# Patient Record
Sex: Female | Born: 1935 | Race: White | Hispanic: No | Smoking: Never smoker
Health system: Southern US, Community
[De-identification: ages and names within clinical notes are randomized; demographics above are authoritative.]

## PROBLEM LIST (undated history)

## (undated) DIAGNOSIS — I219 Acute myocardial infarction, unspecified: Secondary | ICD-10-CM

## (undated) DIAGNOSIS — J45909 Unspecified asthma, uncomplicated: Secondary | ICD-10-CM

## (undated) HISTORY — PX: CORONARY ARTERY BYPASS GRAFT: SHX141

## (undated) HISTORY — PX: ABDOMINAL HYSTERECTOMY: SHX81

---

## 1999-05-13 ENCOUNTER — Inpatient Hospital Stay (HOSPITAL_COMMUNITY): Admission: AD | Admit: 1999-05-13 | Discharge: 1999-05-28 | Payer: Self-pay | Admitting: Cardiology

## 1999-05-14 ENCOUNTER — Encounter: Payer: Self-pay | Admitting: Cardiothoracic Surgery

## 1999-05-15 ENCOUNTER — Encounter: Payer: Self-pay | Admitting: Thoracic Surgery (Cardiothoracic Vascular Surgery)

## 1999-05-16 ENCOUNTER — Encounter: Payer: Self-pay | Admitting: Thoracic Surgery (Cardiothoracic Vascular Surgery)

## 1999-05-17 ENCOUNTER — Encounter: Payer: Self-pay | Admitting: Thoracic Surgery (Cardiothoracic Vascular Surgery)

## 1999-05-20 ENCOUNTER — Encounter: Payer: Self-pay | Admitting: Thoracic Surgery (Cardiothoracic Vascular Surgery)

## 1999-05-23 ENCOUNTER — Encounter: Payer: Self-pay | Admitting: Thoracic Surgery (Cardiothoracic Vascular Surgery)

## 1999-10-06 ENCOUNTER — Inpatient Hospital Stay (HOSPITAL_COMMUNITY): Admission: EM | Admit: 1999-10-06 | Discharge: 1999-10-07 | Payer: Self-pay | Admitting: Cardiovascular Disease

## 2000-07-27 ENCOUNTER — Ambulatory Visit (HOSPITAL_COMMUNITY): Admission: RE | Admit: 2000-07-27 | Discharge: 2000-07-27 | Payer: Self-pay | Admitting: Family Medicine

## 2000-08-18 ENCOUNTER — Ambulatory Visit (HOSPITAL_COMMUNITY): Admission: RE | Admit: 2000-08-18 | Discharge: 2000-08-18 | Payer: Self-pay | Admitting: Internal Medicine

## 2000-08-18 ENCOUNTER — Encounter: Payer: Self-pay | Admitting: Internal Medicine

## 2000-10-04 ENCOUNTER — Ambulatory Visit (HOSPITAL_COMMUNITY): Admission: RE | Admit: 2000-10-04 | Discharge: 2000-10-04 | Payer: Self-pay | Admitting: Family Medicine

## 2000-10-04 ENCOUNTER — Encounter: Payer: Self-pay | Admitting: Family Medicine

## 2000-10-08 ENCOUNTER — Encounter: Payer: Self-pay | Admitting: Family Medicine

## 2000-10-08 ENCOUNTER — Ambulatory Visit (HOSPITAL_COMMUNITY): Admission: RE | Admit: 2000-10-08 | Discharge: 2000-10-08 | Payer: Self-pay | Admitting: Family Medicine

## 2000-12-07 ENCOUNTER — Encounter: Payer: Self-pay | Admitting: Family Medicine

## 2000-12-07 ENCOUNTER — Ambulatory Visit (HOSPITAL_COMMUNITY): Admission: RE | Admit: 2000-12-07 | Discharge: 2000-12-07 | Payer: Self-pay | Admitting: Family Medicine

## 2003-06-11 ENCOUNTER — Ambulatory Visit (HOSPITAL_COMMUNITY): Admission: RE | Admit: 2003-06-11 | Discharge: 2003-06-11 | Payer: Self-pay | Admitting: Internal Medicine

## 2003-06-11 ENCOUNTER — Encounter (HOSPITAL_COMMUNITY): Admission: RE | Admit: 2003-06-11 | Discharge: 2003-07-11 | Payer: Self-pay | Admitting: *Deleted

## 2003-11-06 ENCOUNTER — Ambulatory Visit (HOSPITAL_COMMUNITY): Admission: RE | Admit: 2003-11-06 | Discharge: 2003-11-06 | Payer: Self-pay | Admitting: Family Medicine

## 2004-02-13 ENCOUNTER — Ambulatory Visit (HOSPITAL_COMMUNITY): Admission: RE | Admit: 2004-02-13 | Discharge: 2004-02-13 | Payer: Self-pay | Admitting: Family Medicine

## 2004-05-02 ENCOUNTER — Ambulatory Visit (HOSPITAL_COMMUNITY): Admission: RE | Admit: 2004-05-02 | Discharge: 2004-05-02 | Payer: Self-pay | Admitting: Family Medicine

## 2004-06-27 ENCOUNTER — Ambulatory Visit (HOSPITAL_COMMUNITY): Admission: RE | Admit: 2004-06-27 | Discharge: 2004-06-27 | Payer: Self-pay | Admitting: Family Medicine

## 2005-02-10 ENCOUNTER — Ambulatory Visit: Payer: Self-pay | Admitting: Internal Medicine

## 2005-06-08 ENCOUNTER — Ambulatory Visit: Payer: Self-pay | Admitting: Internal Medicine

## 2005-07-17 ENCOUNTER — Ambulatory Visit (HOSPITAL_COMMUNITY): Admission: RE | Admit: 2005-07-17 | Discharge: 2005-07-17 | Payer: Self-pay | Admitting: Family Medicine

## 2005-09-08 ENCOUNTER — Ambulatory Visit: Payer: Self-pay | Admitting: *Deleted

## 2005-09-10 ENCOUNTER — Encounter (HOSPITAL_COMMUNITY): Admission: RE | Admit: 2005-09-10 | Discharge: 2005-09-11 | Payer: Self-pay | Admitting: *Deleted

## 2005-09-11 ENCOUNTER — Ambulatory Visit: Payer: Self-pay | Admitting: *Deleted

## 2005-09-16 ENCOUNTER — Ambulatory Visit: Payer: Self-pay | Admitting: *Deleted

## 2005-11-13 ENCOUNTER — Ambulatory Visit (HOSPITAL_COMMUNITY): Admission: RE | Admit: 2005-11-13 | Discharge: 2005-11-13 | Payer: Self-pay

## 2005-11-25 ENCOUNTER — Ambulatory Visit: Payer: Self-pay | Admitting: Internal Medicine

## 2005-12-09 ENCOUNTER — Ambulatory Visit (HOSPITAL_COMMUNITY): Admission: RE | Admit: 2005-12-09 | Discharge: 2005-12-09 | Payer: Self-pay | Admitting: Family Medicine

## 2005-12-14 ENCOUNTER — Ambulatory Visit: Payer: Self-pay | Admitting: Internal Medicine

## 2005-12-14 ENCOUNTER — Ambulatory Visit (HOSPITAL_COMMUNITY): Admission: RE | Admit: 2005-12-14 | Discharge: 2005-12-14 | Payer: Self-pay | Admitting: Internal Medicine

## 2006-01-20 ENCOUNTER — Ambulatory Visit: Payer: Self-pay | Admitting: Internal Medicine

## 2006-02-05 ENCOUNTER — Ambulatory Visit: Payer: Self-pay | Admitting: Internal Medicine

## 2006-02-16 ENCOUNTER — Encounter (HOSPITAL_COMMUNITY): Admission: RE | Admit: 2006-02-16 | Discharge: 2006-03-18 | Payer: Self-pay | Admitting: Internal Medicine

## 2006-03-11 ENCOUNTER — Ambulatory Visit: Payer: Self-pay | Admitting: Internal Medicine

## 2006-06-02 ENCOUNTER — Ambulatory Visit: Payer: Self-pay | Admitting: Cardiovascular Disease

## 2008-12-18 ENCOUNTER — Ambulatory Visit (HOSPITAL_COMMUNITY): Admission: RE | Admit: 2008-12-18 | Discharge: 2008-12-18 | Payer: Self-pay | Admitting: Family Medicine

## 2009-01-31 ENCOUNTER — Ambulatory Visit (HOSPITAL_COMMUNITY): Admission: RE | Admit: 2009-01-31 | Discharge: 2009-01-31 | Payer: Self-pay | Admitting: Family Medicine

## 2009-09-24 ENCOUNTER — Ambulatory Visit (HOSPITAL_COMMUNITY): Admission: RE | Admit: 2009-09-24 | Discharge: 2009-09-24 | Payer: Self-pay | Admitting: Family Medicine

## 2010-08-08 NOTE — Procedures (Signed)
[name], [name] NO.:  [id number]   MEDICAL RECORD NO.:  [id number]          PATIENT TYPE:  REC   LOCATION:  RADP                          FACILITY:  [facility]   PHYSICIAN:  Vida Roller, M.D.   DATE OF BIRTH:  01-Jul-1926   DATE OF PROCEDURE:  09/10/2005  DATE OF DISCHARGE:                                    STRESS TEST   HISTORY:  [name] is a 77 year old female with known coronary disease  status post CABG 2001, Adenosine Cardiolite 2005 with a low-risk scan with  normal LV function.  The patient now with recurrent episode of chest  discomfort.   BASELINE DATA:  Electrocardiogram reveals sinus rhythm at 74 beats per  minute, poor R wave progression blood pressure is 148/60.   DESCRIPTION OF PROCEDURE:  Dobutamine was infused up to 30 mcg with the  addition of isometric exercise.  The patient had significant chest  discomfort, shortness of breath, nausea and flushing.  No arrhythmias or  ischemic changes were noted during dobutamine infusion.  Dobutamine was  stopped secondary to her severe chest discomfort and the patient being  unable to tolerate this.  She had reach to 81% of predicted maximum heart  rate.  Myoview was injected.  She states this episode of chest discomfort  was similar to her episode she described to Dr. Dorethea Clan in the office.   Symptoms resolved after about 13 minutes in recovery.   Images and results are pending M.D. review.      Jae Dire, P.A. [facility]      Vida Roller, M.D.  Electronically Signed    AB/MEDQ  D:  09/10/2005  T:  09/10/2005  Job:  [id number]

## 2010-08-08 NOTE — Op Note (Signed)
[name], [name]             ACCOUNT NO.:  [id number]   MEDICAL RECORD NO.:  [id number]          PATIENT TYPE:  AMB   LOCATION:  DAY                           FACILITY:  [facility]   PHYSICIAN:  Lionel December, M.D.    DATE OF BIRTH:  05/17/1926   DATE OF PROCEDURE:  12/14/2005  DATE OF DISCHARGE:  12/14/2005                                  PROCEDURE NOTE   PROCEDURE:  Esophagogastroduodenoscopy followed by colonoscopy.   ENDOSCOPIST:  Lionel December, M.D.   INDICATIONS:  [name] is a 77 year old Caucasian female with chronic GERD, who  remains refractory to therapy.  Unfortunately, she has not been able to  tolerate PPI and is presently antireflux measures and H2B.  She is also  undergoing colonoscopy for surveillance purposes.  She had a sigmoidoscopy  in October 1999 with removal of a small tubular adenoma, but has not been  able to return for colonoscopy until now.  Family history is significant for  colon carcinoma in her father.  She is status post therapy for H. pylori  gastritis in the past.   Procedure risks were reviewed the patient and informed consent was obtained.   MEDICATIONS FOR CONSCIOUS SEDATION:  Benzocaine spray for pharyngeal topical  anesthesia, Demerol 50 mg IV, Versed 6 mg IV in divided dose.   FINDINGS:  Procedure was performed in endoscopy suite.  The patient's vital  signs and O2 SATs were monitored during the procedure and remained stable.   PROCEDURE #1 -- ESOPHAGOGASTRODUODENOSCOPY:  The patient was placed in the  left lateral decubitus position and Olympus videoscope was passed via  oropharynx without any difficulty into esophagus.   ESOPHAGUS:  Mucosa of the esophagus was normal.  The GE junction was at 36  cm from the incisors.  No erosions were noted on this exam, as on a previous  exam of August 2001.  Hiatus was at 39 cm.  She had a small sliding hiatal  hernia.  Incomplete ring was noted involving the right half of GEJ.   STOMACH:  It was  empty and distended very well with insufflation.  Folds of  the proximal stomach were normal.  Examination of mucosa at body, antrum,  pyloric channel as well as angularis, fundus and cardia was normal.   DUODENUM:  Bulbar mucosa was normal.  Scope was passed into second part of  the duodenum, where the mucosa and folds were normal.  Endoscope was  withdrawn and the patient prepared for procedure #2.   PROCEDURE #2 - COLONOSCOPY:  Rectal examination was performed.  No  abnormality was noted on external or digital exam.  Olympus videoscope was  placed in the rectum and advanced under vision into sigmoid colon and  beyond.  Preparation was excellent.  A single small diverticulum was noted  in the sigmoid colon.  Scope was passed into the cecum, which was identified  by appendiceal orifice and ileocecal valve.  Pictures taken for the record.  As the scope was withdrawn, colonic mucosa was examined for the second time  and no other abnormalities were noted.  Rectal mucosa was normal.  Scope was  retroflexed to examine anorectal junction and hemorrhoids were noted below  the dentate line.  Endoscope was straightened and withdrawn.  The patient  tolerated the procedure well.   FINAL DIAGNOSES:  1. Small sliding hiatal hernia, otherwise normal      esophagogastroduodenoscopy.  2. A single small diverticulum at sigmoid colon and external hemorrhoids,      otherwise normal colonoscopy.   RECOMMENDATIONS:  1. She will continue antireflux measures with Zantac at 150 mg in a.m. and      300 mg at bedtime.  2. Reglan 5 mg p.o. a.c. and nightly.  Prescription given for 60 pills      with 2 refills.   She will return for OV in 1 month from now.      Lionel December, M.D.  Electronically Signed     NR/MEDQ  D:  12/14/2005  T:  12/16/2005  Job:  [id number]   cc:   Angus G. Renard Matter, MD  Fax: [phone]

## 2010-08-08 NOTE — Procedures (Signed)
[name], [name]                       ACCOUNT NO.:  [id number]   MEDICAL RECORD NO.:  [id number]                   PATIENT TYPE:  OUT   LOCATION:  RAD                                  FACILITY:  [facility]   PHYSICIAN:  Vida Roller, M.D.                DATE OF BIRTH:  October 29, 1926   DATE OF PROCEDURE:  06/11/2003  DATE OF DISCHARGE:                                  ECHOCARDIOGRAM   PRIMARY CARE PHYSICIAN:  Angus G. Renard Matter, M.D.   TAPE NUMBER:  [id number], tape count [id number].   INDICATION:  A 77 year old female with known coronary artery disease, status  post bypass surgery in 2001 with increased shortness of breath.   TECHNICAL QUALITY:  Limited.   M-MODE TRACINGS:  1. The aorta is 28 mm.  2. Left atrium is 42 mm.  3. Septum 12 mm.  4. Left ventricular posterior wall is 12 mm.  5. Left ventricular diastolic function is 43 mm.  6. Left ventricular systolic dimension is 27 mm.   2-D AND DOPPLER IMAGING:  1. The left ventricle is normal size with normal systolic function.     Estimated ejection fraction of 65-70%.  There is elements of mild,     concentric hypertrophy.  There is a mild relaxation abnormality seen on     transmitral Doppler pattern.  There are no obvious wall-motion     abnormalities.  2. The right ventricle is normal size with normal systolic function though     not well seen.  3. Both atria appear to be top limits of normal, left greater than right.  4. The aortic valve is sclerotic with no evidence of stenosis or     regurgitation.  5. The mitral valve is mildly thickened and myomatous with mild     insufficiency.  6. The tricuspid valve is morphologically unremarkable with mild     insufficiency.  7. The pulmonic valve was not well seen.  8. The inferior vena cava was not well seen.  9. The ascending aorta is not well seen.  10.      There is a small area of lucency around the pericardium which     probably represents a pericardial fat pad  versus a hemodynamically     insignificant effusion.     ___________________________________________                                            Vida Roller, M.D.   JH/MEDQ  D:  06/11/2003  T:  06/12/2003  Job:  [id number]

## 2010-08-08 NOTE — Procedures (Signed)
NAMECARMELIA, TINER                       ACCOUNT NO.:  [id number]   MEDICAL RECORD NO.:  [id number]                   PATIENT TYPE:  OUT   LOCATION:  RAD                                  FACILITY:  [facility]   PHYSICIAN:  Vida Roller, M.D.                DATE OF BIRTH:  [birth year]   DATE OF PROCEDURE:  DATE OF DISCHARGE:                                    STRESS TEST   PROCEDURE:  Adenosine Cardizem.   INDICATION:  A 78 year old female with known coronary artery disease, status  post coronary artery bypass graft in 2001, with the following grafts:  LIMA  to LAD, SVG to OM1, SVG to D1.  The patient now presents with atypical chest  discomfort.   BASELINE DATA:  EKG revealed sinus bradycardia at 55 beats per minute with  nonspecific ST abnormalities.  Blood pressure 138/60.   Adenosine 58 mg was infused over four minute protocol with Cardiolite  injected at three minutes.  The patient reported chest pain, shortness of  breath, flushing and nausea, all of which resolved in recovery.  EKG  revealed no ischemic changes and no arrhythmias.   FINAL IMAGES AND RESULTS:  Pending M.D. review.     ________________________________________  ___________________________________________  Jae Dire, P.A. [facility]                      Vida Roller, M.D.   AB/MEDQ  D:  06/11/2003  T:  06/12/2003  Job:  [id number]

## 2010-08-08 NOTE — Consult Note (Signed)
[place]  Patient:    [name], [name]                      MRN: [id number] Proc. Date: 05/13/99 Adm. Date:  [id number] Attending:  Mirian Mo CC:         Jesse Sans. Wall, M.D. [facility]             CVTS Office                          Consultation Report  REASON FOR CONSULTATION:  Severe three-vessel coronary disease, with recent admission for unstable angina.  HISTORY OF PRESENT ILLNESS:  The patient is a 77 year old female from [place], [place], whose primary care physician is Dr. Renard Matter.  She is admitted for cardiac catheterization by Dr. Valera Castle.  She has a history of congestive heart failure and was apparently evaluated by Dr. Daleen Squibb in his [place] office last summer for stress test and echocardiogram; the results of which apparently were unremarkable. Over the past few days she developed heat intolerance and diaphoresis.  This was associated by progressive dyspnea on exertion and chest pressure and chest tightness.  She took some nitroglycerin without relief, and presented to the emergency room at [facility] on May 12, 1999.  At that time her EKG showed no acute changes, and she was admitted for rule out MI and for treatment of her unstable angina.  She was evaluated by Dr. Daleen Squibb in [facility] and she was transferred to [facility] for catheterization.  She was felt to possibly have diastolic dysfunction as a component of her congestive heart failure, but lso had a borderline elevation of her Troponin enzyme.  She was placed on nitroglycerin and Lovenox; transferred to [facility].  PAST MEDICAL HISTORY: 1. Obesity. 2. Asthma. 3. Hypertension. 4. History of esophageal strictures, status post esophageal dilatation. 5. Hysterectomy. 6. Left ear surgery.  Apparently at the time of the ear surgery, vein was    harvested from both ankles for use in the reconstruction of her ear tissue.  HOME  MEDICATIONS: 1. Potassium 20 mEq q.d. 2. Premarin 1.2 mg q.d. 3. Avapro 150 mg p.o. q.d. 4. Lasix 40 mg q.d. 5. Levaquin 500 mg p.o. q.d. 6. Prevacid 30 mg p.o. q.d.  ALLERGIES:  PENICILLIN (causes hives),  ASPIRIN (states gives her an upset stomach).  SOCIAL HISTORY:  The patient lives with her disabled mother, who is in her late 79s and has Alzheimers disease.  Her husband, who is in his mid 28s, lives in a nursing home with Parkinsons disease and also some dementia.  She has no children. She has been under a lot of family stress lately due to illnesses.  She denies cigarette smoking or alcohol use.  REVIEW OF SYSTEMS:  The patients surgical history is also positive for lower back surgery, performed at [facility] by Dr. Gordy Levan.  She denies any change in her weight, which is approximately 220 pounds.  She denies any night sweats, fever, change in bowel or bladder habits.  She denies any history of TIA or CVA, syncope or seizure.  She does have history of angina and symptoms of dyspnea on exertion and orthopnea (as stated in previous observations).  Her pulmonary history is positive for asthma, and she takes intermittent bronchodilator MDI.  She denies any serious pulmonary infections, TB or hemoptysis.  She does have intermittent dysphagia, with  reflux, and a history of esophageal dilatations.  She denies any blood per rectum, jaundice or chronic abdominal pain.  There is a questionable history of colitis.  She denies any history of hematuria, kidney stones or polyuria.  She denies any deep venous thrombosis, phlebitis or claudication. She does have problems with her right knee with arthritis and swelling. Hematologically she denies any bleeding, diathesis or easy bruisability.  There is no history of skin rash, skin cancer or lesions.  She does have probable recent  depression with stress from her family illnesses, with some problems with insomnia and her  appetite.  PHYSICAL EXAMINATION:  VITAL SIGNS:  Height 5 foot 2 inches, Weight 220 pounds.  Blood pressure 115/60, pulse 78 and regular.  She is afebrile.  GENERAL APPEARANCE:  That of an elderly, obese white female, in no acute distress.  HEENT:  Normocephalic, with full EOMs.  Pharynx is clear.  She has dental plates, upper and lower.  NECK:  Short, without thyromegaly, mass or carotid bruits.  She has good range f motion.  LUNGS:  Clear to auscultation.  CARDIAC:  Revealed a 2/6 systolic murmur at the left lower sternal border; without S3 or gallop.  There were no chest wall deformities.  ABDOMEN:  Soft, obese, nontender, without mass.  Normal bowel sounds.  EXTREMITIES:  Have 2+ pedal pulses and 2+ radial pulses.  She is right-handed. The right knee is somewhat tender, but without warmth or fluctuance.  NEUROLOGIC:  Alert and oriented x3, with full motor function.  SKIN:  No lesions or rash.  She has small surgical incisions over the medial aspect of both ankles, where saphenous vein was possibly harvested for the ear reconstructive surgery in the past.  LABORATORY DATA:  Her coronary arteriograms, performed by Dr. Antoine Poche, were reviewed.  These demonstrate total occlusion of the right coronary artery, 80% stenosis of the circumflex, and 75% stenosis of the LAD.  Her overall ejection fraction is 55%.  Her pulmonary pressures are normal at 29/13, with a cardiac output of 5 L/min on right heart catheterization.  IMPRESSION:  Severe three-vessel disease with unstable angina, in this elderly,  obese female.  She does have at least mild mitral regurgitation on her cardiac catheterization, and a chest wall transesophageal echocardiogram will be required preoperatively.  Also we will need to do vein mapping on her legs due to her obesity and history of bilateral lower leg vein harvesting.  I discussed the situation with her coronary disease and the plan for  surgery. he understands the general principles of bypass surgery, as I discussed the placement  of the surgical incisions, the choice of conduit, the use of cardiopulmonary bypass and general anesthesia, and expected recovery.  She understands there are alternatives to surgery, as well as risks associated with having bypass surgery. We will perform her preoperative studies and tentatively schedule her for coronary artery bypass grafting on Thursday, May 15, 1999.  This plan was discussed  with the patient, who agrees. DD:  05/13/99 TD:  05/14/99 Job: [id number]

## 2010-08-08 NOTE — H&P (Signed)
[name], [name]             ACCOUNT NO.:  [id number]   MEDICAL RECORD NO.:  [id number]          PATIENT TYPE:  AMB   LOCATION:                                FACILITY:  [facility]   PHYSICIAN:  Lionel December, M.D.    DATE OF BIRTH:  [birth year]   DATE OF ADMISSION:  DATE OF DISCHARGE:  LH                                HISTORY & PHYSICAL   CHIEF COMPLAINT:  Refractory GERD, chronic nausea/history of adenomatous  polyps.   [name] is a 77 year old Caucasian female.  She has a history of  chronic GERD.  She has been intolerant of all PPIs.  She is on Zantac 150 mg  b.i.d.  She continues to complain of daily grinding to the epigastrium.  It is sometimes relieved postprandially; however, she also tells me  everything she eats makes her sick.  She complains of constant nausea.  Occasionally she will vomit.  She says she cannot tolerate milk products.  She has had daily heartburn and indigestion.  Denies any dysphagia or  odynophagia.  Has had some anorexia.  Denies any early satiety.  She had a  history of an adenomatous polyp.  She was scheduled to have colonoscopy and  EGD last year but tells me she was unable to find a driver.   FAMILY HISTORY:  Significant for colon cancer in her father.   PAST MEDICAL HISTORY:  1. Chronic GERD.  Last EGD November 21, 1999, by Dr. Karilyn Cota.  Erosive reflux      esophagitis, small sliding hiatal hernia, mild gastritis.  She was      dilated with a 56 [place] Maloney dilator.  She was Helicobacter pylori      positive, status post treatment.  2. Asthma.  3. COPD.  4. Hypertension.  5. Left cataract.  6. MI.  7. CABG.  8. Back surgery.  9. Hysterectomy.  10.Right ankle surgery.  11.Right wrist surgery.  12.Laparoscopic cholecystectomy secondary to cholelithiasis by Dr.      Leona Carry.  13.Flexible sigmoidoscopy by Dr. Karilyn Cota December 31, 1997, distal sigmoid      colon polyp was a tubular adenoma.  She has not had follow-up       colonoscopy.   CURRENT MEDICATIONS:  1. Zantac 150 mg b.i.d.  2. Klor-Con 20 mEq daily.  3. Avapro 150 mg daily.  4. Albuterol 90 mcg p.r.n.  5. Lasix 40 mg daily.  6. Premarin 0.625 mg daily.  7. Xanax 0.5 mg q.h.s.  8. Atenolol 50 mg b.i.d.  9. NitroQuick p.r.n.  10.Mylanta p.r.n.  11.Tylenol Arthritis p.r.n.  12.Meclizine 12.5-25 mg q.i.d. p.r.n.  13.Tylenol Arthritis once or twice daily.  14.Phenergan 25 mg q.4h. p.r.n.   ALLERGIES:  COATED ASPIRIN and PENICILLIN.   FAMILY HISTORY:  Positive for her father with what she believes is colon  cancer.  Mother is deceased at age of 37 with history of Alzheimer's.   SOCIAL HISTORY:  [name] is currently a widow.  She lives alone.  Denies any tobacco, alcohol or drug use.   REVIEW OF SYSTEMS:  CONSTITUTIONAL:  Weight is stable.  Denies any fever or  chills.  CARDIOVASCULAR:  Denies any chest pain or palpitations.  PULMONARY:  Denies shortness of breath, dyspnea, cough, hemoptysis.  GI:  See HPI.   PHYSICAL EXAMINATION:  VITAL SIGNS:  Weight 242.5 pounds, height 59 inches.  Temperature 98.2, blood pressure 144/68, pulse 68.  GENERAL:  [name] is a 77 year old Caucasian female who is obese.  She  is alert.  She is oriented, pleasant and cooperative, in no acute distress.  HEENT:  Sclerae anicteric.  are benign.  Conjunctivae pink.  Oropharynx  moist without lesions.  NECK:  Supple without evidence of thyromegaly.  CARDIAC:  Regular rate and rhythm, normal S1, S2, without murmurs, gallops  or rubs.  CHEST:  Lungs clear to auscultation bilaterally.  ABDOMEN:  Positive bowel sounds x4.  No bruits auscultated.  Soft,  nontender, nondistended, no hepatosplenomegaly, no rebound or guarding.  EXTREMITIES:  Without clubbing or edema bilaterally.  SKIN:  Pink, warm and dry without rash or jaundice.   IMPRESSION:  [name] is a 77 year old Caucasian female with a history  of chronic gastroesophageal reflux disease.  She  has been intolerant of  proton pump inhibitors, unfortunately.  She has been taking Zantac 150 mg  b.i.d.  She continues to have daily heartburn and indigestion breakthrough  symptoms.  She has a grinding epigastric pain.  She complains of nausea  every time she eats with dry heaves and occasional emesis.  She is going to  need further evaluation with an EGD to rule out complicated gastroesophageal  reflux disease, look for peptic ulcer or occult lesions.  She also has a  history of a colon adenoma, family history of colon cancer.  She is due for  a colonoscopy.   PLAN:  1. Colonoscopy and EGD with Dr. Karilyn Cota in the near future.  I discussed      both procedures, including the risks and benefits, which include but      are not limited to bleeding, infection, perforation, or drug reaction.      She      agrees.  Consent will be obtained.  2. Continue Zantac 150 mg b.i.d.  3. Further recommendations to follow.      Nicholas Lose, N.P.      Lionel December, M.D.  Electronically Signed    KC/MEDQ  D:  11/25/2005  T:  11/25/2005  Job:  [id number]   cc:   Angus G. Renard Matter, MD  Fax: [phone]

## 2010-08-08 NOTE — H&P (Signed)
NAMEJALAYNA, JOSTEN             ACCOUNT NO.:  [id number]   MEDICAL RECORD NO.:  [id number]          PATIENT TYPE:   LOCATION:                                 FACILITY:   PHYSICIAN:  Lionel December, M.D.    DATE OF BIRTH:  1927/02/26   DATE OF ADMISSION:  DATE OF DISCHARGE:  LH                                HISTORY & PHYSICAL   CHIEF COMPLAINT:  EGD with possible ED/surveillance colonoscopy.   PRIMARY CARE PHYSICIAN:  Angus G. Renard Matter, M.D.   HISTORY OF PRESENT ILLNESS:  Mrs. Singleterry is a 77 year old Caucasian  female with history of chronic GERD who has been intolerant to PPIs in the  past.  She presents today complaining of severe refractory GERD symptoms  including frequency regurgitation, water brash, indigestion, heartburn, on a  daily basis.  There is very little time where she is without symptoms.  Her  symptoms are worse at bedtime. She is unable to sleep.  She describes some  burning retrosternally at all times. She has dysphagia to both liquids and  solids.  She has constant chronic nausea. She also has burning odynophagia  when she eats.  She denies any vomiting. She denies any low abdominal pain.  Her bowel movements have been normal, soft and brown, once daily without any  rectal bleeding or melena.  She denies any anorexia but does report early  satiety.  She also complains of abdominal bloating and increased belching.  She is taking Zantac 150 mg twice a day as well as p.r.n. Mylanta.  She has  never been able to tolerate PPIs.  She has been tried on Prevacid, Prilosec  and Nexium which caused weakness and Aciphex which caused abdominal pain,  diarrhea and rectal bleeding.   PAST MEDICAL HISTORY:  1.  Chronic GERD.  Last EGD November 21, 1999 by Dr. Karilyn Cota.  She was found to      have reflux esophagitis, small sliding hiatal hernia, abnormal patch of      the gastric mucosa at the body which was biopsied to be mild gastritis.      She was dilated with  56-French  [place] dilator.  She was H pylori      positive, status post treatment with triple drug therapy.  2.  She has history of asthma and COPD.  3.  Hypertension.  4.  Left eye cataract surgery.  5.  History of MI and CABG x4.  6.  Back surgery.  7.  Hysterectomy.  8.  Right ankle surgery.  9.  Right wrist surgery.  10. Laparoscopic cholecystectomy secondary to cholelithiasis to Dr.      Leona Carry.  11. She had a flexible sigmoidoscopy by Dr. Karilyn Cota in December 31, 1997 for      the tiny polyp at the distal sigmoid colon which was a tubular adenoma.      She has not had followup colonoscopy.   CURRENT MEDICATIONS:  1.  Zantac 150 mg twice a day.  2.  Klor-Con 20 mEq daily.  3.  Avapro 150 mg daily.  4.  Albuterol inhaler p.r.n.  5.  Lasix 40 mg daily.  6.  Premarin 0.625 mg daily.  7.  Xanax 0.5 mg one-and-a-half at bedtime.  8.  Atenolol 50 mg twice a day.  9.  Mylanta p.r.n.  10. Meclizine 12.5 mg half to one four times a day p.r.n.  11. Tylenol Arthritis two p.o. daily.   ALLERGIES:  ASPIRIN, CODEINE, PENICILLIN.  She has been intolerant to  multiple PPIs.   FAMILY HISTORY:  No known family history of colorectal carcinoma or chronic  GI problems.  Mother deceased at age 47 with history of Alzheimer's disease.  Father deceased at age 34 secondary to carcinoma of unknown etiology.   SOCIAL HISTORY:  Mrs. Davidson is currently a widow.  She lives alone.  She  pretty much cared for her husband throughout his lifetime. She denies any  tobacco, alcohol or drug use.   REVIEW OF SYSTEMS:  CONSTITUTIONAL:  Weight steadily increasing.  She has  complaints of fatigue.  Denies any fever or chills.  CARDIOVASCULAR:  Denies  any chest pain or palpitations.  PULMONARY:  She does have shortness of  breath on exertion, chronic cough, history of COPD.  Denies any hemoptysis.  GI:  See HPI.   PHYSICAL EXAMINATION:  VITAL SIGNS:  Weight 252 pounds, height 60 inches,  temperature 98 degrees,  blood pressure 142/64, pulse 82.  GENERAL:  Mrs. Amoroso is a 77 year old, obese, Caucasian female who is  alert, pleasant, cooperative, in no acute distress.  She ambulates with a  four-point cane.  HEENT:  Sclerae clear, non-icteric.  Conjunctivae pink.  Oropharynx pink and  moist.  She does have upper and lower dentures intact.  NECK:  Supple without any masses or thyromegaly.  HEART:  Regular rate and rhythm with normal S1 and S2 without any murmurs,  clicks, rubs, or gallops.  LUNGS:  Clear to auscultation bilaterally.  ABDOMEN:  Protuberant with positive bowel sounds x4.  No bruits auscultated.  The abdomen is soft, nontender, nondistended without palpable masses or  hepatosplenomegaly.  No rebound tenderness or guarding.  RECTAL:  Deferred.  EXTREMITIES:  Gross pretibial ankle and pedal edema bilaterally.  SKIN:  Pink, warm and dry without rash or jaundice.   LABORATORY DATA:  From Dr. Renard Matter' office from January 31, 2005.  WBC 6.8,  hemoglobin 11.9, hematocrit 40, platelets 260.  Sodium 140, potassium 5.4,  chloride 101, CO2 29, glucose 97, BUN 20, creatinine 1, calcium 9.3.   IMPRESSION:  Mrs. Criswell is a 77 year old Caucasian female who has long-  standing history of chronic gastroesophageal reflux disease with refractory  symptoms.  She has been taking Zantac 150 mg twice a day as she has been  unable to tolerate proton pump inhibitor therapy due to side effects.  She  continues to have daily bothersome symptoms including regurgitation, water  brash, indigestion and heartburn. Her symptoms are definitely worse at  night.  She is also experiencing dysphagia to both liquids and solids as  well as odynophagia.  She is going to need her upper GI tract reevaluated  given her severity of symptoms and to rule out complicated gastroesophageal  reflux disease.  She may also benefit from a Bravo study to determine whether she would be a candidate for possible antireflux therapy  although  given her age, she may not want to pursue this.   She also has history of adenomatous polyps on flexible sigmoidoscopy seven  years ago. She is agreeing to colonoscopy today for followup.   PLAN:  1.  Will scheduled EGD with possible ED and Bravo study as well as screening      colonoscopy in the near future.  I have discussed all of the procedures      including risks and benefits which include but are not limited to      bleeding, infection, perforation, drug reaction.  She agrees with the      plan and consent to be obtained.  2.  Further recommendations to follow.      Nicholas Lose, N.P.      Lionel December, M.D.  Electronically Signed    KC/MEDQ  D:  02/10/2005  T:  02/10/2005  Job:  [id number]   cc:   Angus G. Renard Matter, MD  Fax: [phone]

## 2010-08-08 NOTE — Op Note (Signed)
[place]  Patient:    [name], [name]                      MRN: [id number] Proc. Date: 05/15/99 Adm. Date:  [id number] Attending:  Tressie Stalker CC:         Jesse Sans. Wall, M.D. [facility]             Rollene Rotunda, M.D. [facility]             Butch Penny, M.D.             CVTS Office                           Operative Report  PREOPERATIVE DIAGNOSIS:  Severe 3-vessel coronary artery disease with class 4 unstable angina, class 3 congestive heart failure.  POSTOPERATIVE DIAGNOSIS:  Severe 3-vessel coronary artery disease with class 4 unstable angina, class 3 congestive heart failure.  PROCEDURE:  Median sternotomy for coronary artery bypass grafting x 4 (left internal mammary artery to distal left anterior descending coronary artery, saphenous vein graft to first diagonal branch, saphenous vein graft to circumflex marginal branch, and saphenous vein graft to distal right coronary artery).  SURGEON:  Salvatore Decent. Cornelius Moras, M.D.  ASSISTANTLuretha Rued. Ezzard Standing, P.A.  ANESTHESIA:  General.  BRIEF CLINICAL NOTE:  Patient is a 77 year old morbidly obese white female from  [place], [place] followed by Dr. Butch Penny and Jesse Sans. Wall and referred by Dr. Rollene Rotunda for management of coronary artery disease.  [name]  presents with new onset class 4 stable angina and symptoms of class 3 congestive heart failure.  Cardiac catheterization performed by Dr. Rollene Rotunda demonstrates severe 3-vessel coronary artery disease with mild left ventricular  dysfunction, inferior wall hypokinesis and mild mitral regurgitation.  The patient was counselled at length regarding the indications and potential benefits of coronary artery bypass grafting.  She understands the associated risks of surgery, including, but not limited to risk of death, stroke, myocardial infarction, bleeding requiring blood transfusion, arrhythmias, infection, and  recurrent coronary artery disease.  She accepts these risks, as well as, any unforeseen complications and agrees to proceed with surgery as described.  OPERATIVE NOTE IN DETAIL:  The patient was brought to the operating room on the  above-mentioned date and invasive hemodynamic monitoring was established by the  anesthesia service under the care and direction of Dr. Arta Bruce.  The patient was placed in the supine position on the operating table.  Following induction ith general endotracheal anesthesia, preoperative transesophageal echocardiogram was performed by Dr. Michelle Piper.  This demonstrates mild left ventricular dysfunction with inferior wall hypokinesis, but otherwise relatively normal-appearing contractility. There is trace mitral regurgitation with normal-appearing leaflets of the mitral valve.  No other significant abnormalities were identified.  The patients chest, abdomen, both groins and both lower extremities were prepared and draped in a sterile manner.  A median sternotomy incision was performed and the left internal mammary artery was dissected from the chest wall and prepared for  bypass grafting.  The left internal mammary artery was notably good quality conduit for bypass grafting.  Simultaneously, saphenous vein was obtained from the right lower extremity using endoscopic vein harvest technique to remove the entire saphenous vein from the right thigh, plus open technique to remove additional segments of saphenous vein below the knee on the right.  The saphenous vein was  notably fair quality  conduit and was moderately dilated, but without significant varicosities.  The patient was heparinized systemically.  The pericardium was opened.  The ascending aorta was palpated and was notably free of an palpable plaques or calcifications.  The ascending aorta and the right atrium were cannulated for cardiopulmonary bypass.  Adequate heparinization  was verified.  Cardiopulmonary bypass was begun and the surface of the heart was inspected. The heart has a large amount of epicardial fat.  There was mild to moderate left ventricular hypertrophy and dilatation.  Distal sites were selected for coronary bypass grafting.  Portions of saphenous vein and the left internal mammary artery were trimmed to appropriate lengths.  A temperature probe was placed in the left ventricular septum and a styrofoam pad was placed to protect the left phrenic nerve from thermal injury.  A cardioplegia catheter was placed in the ascending aorta.  The patient was cooled to 32 degrees systemic temperature.  The aortic cross-clamp was applied and cardioplegia was delivered in antegrade fashion through the aortic root. Additional doses of cardioplegia were administered both through the aortic root and down the subsequently placed vein graft to maintain septal temperature  below 15 degrees Centigrade throughout the cross-clamp portion of the operation. The following distal coronary anastomoses were performed:  1.  The distal right coronary artery was grafted with a saphenous vein graft in an end-to-side fashion using running 7-0 Prolene suture.  This coronary measures 2.2 mm in diameter and was totally occluded proximally.  It was fair to good quality at the site of distal bypass.  2.  The circumflex marginal branch was grafted with a saphenous vein graft in an end-to-side fashion using running 7-0 Prolene suture.  This coronary measures 1.5 mm in diameter and has 90% proximal stenosis.  This coronary was of fair to  good quality at the site of distal bypass.  3.  The first diagonal branch off the left anterior descending coronary artery as grafted with a saphenous vein graft using running 7-0 Prolene suture.  This coronary measures 1.5 mm diameter and has 80% proximal stenosis.  This coronary was of fair quality at the site of distal  bypass.  4. The distal left anterior descending coronary artery was grafted with the left  internal mammary artery using running 8-0 Prolene suture.  This coronary measures 1.5 mm in diameter and has 90% proximal stenosis.  This coronary was of good quality at the site of distal bypass.  All three proximal saphenous vein anastomoses were performed directly to the ascending aorta prior to removal of he aortic cross-clamp.  The septal temperature was noted to rise rapidly and dramatically upon reperfusion of the left internal mammary artery.  The patient was placed in Trendelenburg position and the aortic root was deaired.  The aortic cross-clamp was removed after a total cross-clamp time of 81 minutes.  The heart resumed rhythm spontaneously without need for cardioversion.  The patient was rewarmed to greater than 37 degrees Centigrade temperature.  All proximal and distal anastomoses were inspected for hemostasis and appropriate graft orientation. Epicardial pacing wires were affixed to the right ventricular outflow tract and to the right atrial appendage.  The patient was weaned from cardiopulmonary bypass  without difficulty.  The patients rhythm at separation from bypass was a sinus bradycardia with 1st degree A-V block requiring dual-chamber A-V sequential pacing. The patient was weaned from bypass on low-dose dopamine infusion.  Total cardiopulmonary bypass time for the operation was 105 minutes.  Follow-up transesophageal echocardiogram following  separation from bypass demonstrates well-preserved left ventricular function with trivial mitral regurgitation.  The venous and arterial cannulae were removed uneventfully. Protamine was administered to reverse the anticoagulation.  The mediastinum and the left chest were irrigated with saline solution containing vancomycin. Meticulous surgical hemostasis was ascertained.  The mediastinum and left chest were drained with three  chest tubes placed through separate stab incisions inferiorly.  The median sternotomy was closed in routine fashion.  All right lower extremity incisions were closed in multiple layers in  routine fashion after placement of a 19-French Blake drain to drain the deep subcutaneous tissues of the right thigh.  The sternal skin incisions was closed  with a subcuticular skin closure, whereas the right lower extremity incision were closed with skin staples.  The patient tolerated the procedure well and was transported to the surgical intensive care unit in stable condition.  By the time of transport to the intensive care unit, the patient had resumed normal sinus rhythm without need for pacing. No autologous blood products were administered.  All sponge, instrument and needle  counts were verified correct at completion of the operation. DD:  05/15/99 TD:  05/15/99 Job: [id number]

## 2010-08-08 NOTE — Assessment & Plan Note (Signed)
[name]                       [name] NOTE   [name], [name]                    MRN:          [id number]  DATE:06/02/2006                            DOB:          January 02, 1927    [name] is a pleasant 77 year old patient of Dr. Renard Matter.  She  needs a right knee replacement.  She has a history of coronary artery  bypass surgery in 2001.  She had a LIMA to the LAD, vein graft to the  right, vein graft to the OM, vein graft to the diagonal.   She has normal LV function.  She had an adenosine Myoview that was low  risk in March 2005 and a dobutamine Myoview that was normal in June  2007.  Her EF was 76% at that time.  She has had a bad reaction to  adenosine and will not take this anymore.   She has had a history of postoperative atrial fibrillation.   Review of systems remarkable for significant right knee pain.  She gets  around with a cane and walker.  She has not had any significant chest  pain, PND, or orthopnea.  There has been chronic lower extremity edema.  Her past medical history also includes PAF, GERD, previous esophageal  stricture, dyspepsia, dyslipidemia and hypertension.   She is allergic to ASPIRIN, PENICILLIN, and CORTISONE.   The patient's medications include:  1. Lasix 40 a day.  2. K-Dur 20 a day.  3. Premarin 0.625 a day.  4. Atenolol 50 b.i.d.  5. As aspirin a day.  6. Avapro 300 a day.  7. Crestor 10 a day.  8. P.r.n. Xanax.  9. Ranitidine 150 b.i.d.   She is widowed.  She lives by herself.  She is somewhat in a quandary.  She would like to have her surgery either in [place] or [place].  However, she lives alone, she has no family, she is widowed, and was  thinking of doing the surgery in [place] because of this.   She does not smoke or drink.   The patient's past surgical history includes previous back surgery and  previous hysterectomy.   Her exam is remarkable for an  overweight female in no distress.  Blood  pressure is 130/60, pulse 68 and regular. HEENT is normal.  Carotids  normal without bruit.  Lungs are clear.  There is an S1, S2 with normal  heart sounds.  Abdomen is benign.  She is status post hysterectomy.  Distal pulses are intact with +1-2 lower extremity edema bilaterally.   Her baseline EKG is sinus rhythm with nonspecific ST-T wave changes and  poor R wave progression.   IMPRESSION:  The patient is cleared for knee surgery.  She has not had  any significant symptoms referable to her heart.  She has had low-risk  Myoviews in March 2005 and June 2007.  I do not think she needs a  catheterization to further define her risks.  She is at risk for  postoperative atrial fibrillation which can be watched for.  Dr. Andee Lineman  in our [place] office can follow the patient perioperatively if  Dr. Lorn Junes  does her surgery, or we can follow her here at [facility] if she chooses  to have Dr. Romeo Apple do her surgery.   The patient will continue her aspirin therapy.  She will continue her  beta blocker right up to the morning of surgery.  Her blood pressure  seems well controlled.   Overall, she is at moderate risk due to her body habitus and age, but  given her low-risk Myoviews and lack of chest pain, I think her heart  would be suitable for surgery.     Molly Pick. Eden Emms, MD, [facility]  Electronically Signed    PCN/MedQ  DD: 06/02/2006  DT: 06/04/2006  Job #: [id number]   cc:   Angus G. Renard Matter, MD  Benn Moulder, M.D.  Vickki Hearing, M.D.

## 2010-08-08 NOTE — Discharge Summary (Signed)
[place]. [facility]  Patient:    [name], [name]                      MRN: [id number] Adm. Date:  [id number] Disc. Date: [id number] Attending:  Tressie Vargas Dictator:   [name], P.A. CC:         [name]. [name], M.D.             Thomas C. Wall, M.D. [facility]             Butch Penny, M.D.                           Discharge Summary  DATE OF BIRTH:  01-12-1927.  ADMISSION DIAGNOSES: 1. Unstable angina. 2. History of congestive heart failure secondary to diastolic dysfunction. 3. Hypertension. 4. Asthma. 5. Obesity. 6. History of esophageal strictures. 7. History of lower extremity vein harvesting for ear surgery.  DISCHARGE DIAGNOSES: 1. Severe three vessel coronary artery disease treated with CABG. 2. Postoperative rapid atrial fibrillation, stable on antidysrhythmics and    Coumadin. 3. History of congestive heart failure. 4. Hypertension. 5. Asthma. 6. Obesity.  PROCEDURES: 1. Heart catheterization on May 13, 1999 which indicated the following:    Total occlusion of RCA, 80% circumflex stenosis, 75% LAD stenosis.  Ejection    fraction of 65%. 2. CABG x 4 on May 15, 1999 by Dr. Cornelius Vargas, assisted by [name], P.A.C.,    using endoscopic vein from the right thigh.  The following grafts were done:  LIMA to LAD, saphenous vein graFt to RCA, saphenous vein graft to OM, saphenous    saphenous vein graft to the first diagonal.  BRIEF HISTORY:  The patient is a pleasant 77 year old female from [place], [place], whose primary care physician is Dr. Renard Matter.  She has a history of CHF and was apparently evaluated by Dr. Daleen Squibb in his [place] office last summer for stress test and echocardiogram, the results of which apparently were unremarkable. Before admission she had developed heat intolerance and diaphoresis.  This was associated with progressive dyspnea on exertion and chest pressure.  She tried nitroglycerin without relief  and presented to the emergency room at [facility] on May 12, 1999.  At that time her EKG showed no acute changes and she was admitted to rule out MI and for treatment of her unstable angina.  She as evaluated by Dr. Daleen Squibb at [facility] and was transferred to [facility] or catheterization.  She was felt to possibly have diastolic dysfunction as a component of her CHF, but also had a borderline elevation of her troponin enzyme. She was placed on nitroglycerin and Lovenox and transferred to [facility].  HOSPITAL COURSE:  She was admitted May 13, 1999, and underwent the coronary arteriogram by Dr. Antoine Vargas.  CVTS consult was called for and Dr. Kathlee Nations Vargas evaluated [name].  After reviewing the data and examining her, he concluded that coronary artery bypass grafting was the best treatment for her.  He discussed the operation with her.  He discussed the risks and benefits of surgery and alternatives.  Pre CABG Dopplers were performed on February 21 which showed no evidence of ICA stenosis.  Palpable lower extremity pulses were noted. Saphenous vein mapping was carried out May 14, 1999 because of previous history of vein harvesting.  It indicated that the veins appeared to be adequate in size to  the  lower calf.  Dr. Cornelius Vargas evaluated [name] on May 14, 1999.  He discussed plans for surgery the following day with her.  She understood the risks and benefits of surgery.  Intraoperative TEE was also planned.  [name] underwent the operation on May 15, 1999.  There were no complications.  She was transferred to SICU in stable condition.  The vein was harvested endoscopically  from the right thigh.  There were no blood products given.  She was in normal sinus rhythm.  On postoperative day #1 she was awake and alert, requesting food.  She was afebrile, in normal sinus rhythm, and hemodynamically stable.  O2  saturations were 98-100% on 2 L of O2.  Chest was clear.  H&H was 8.6/26.6.  Patient was noted to be doing very well.  Plans were made to transfer her to unit 2000.  On May 17, 1999, the patient complained of some dizziness.  H&H at that time was 7.8/24.3.  This was a drop from the day before.  Transfusion of two units packed red blood  cells was given and the patient was heavily diuresed.  Patient had episodes of anxiety postoperatively.  She stated she was on Xanax at home.  Dr. Cornelius Vargas advised he did not want to give any sedative.  On postoperative day #3 patient was noted to be making very slow progress, but overall doing well.  She required a great deal of encouragement.  Her ACE inhibitor was restarted.  Patient had an episode of paroxysmal atrial fibrillation with a heart rate up to 158 on May 18, 1999 in the evening.  Blood pressure was stable.  Digoxin protocol was started. Patient went back into normal sinus rhythm later in the evening.  On May 19, 1999,  patient was noted to be volume overloaded.  She was in normal sinus rhythm. Over the next several days, [name] was in and out of atrial fibrillation with  rate controlled with IV Diltiazem, Lopressor, and Digoxin.  Amiodarone was also  started.  Central line was placed for IV access.  She was also on Lovenox for anticoagulation.  Coumadin was also added on May 22, 1999.  Patient also required an episode of atrial pacing.  She was transferred to [facility] on May 20, 1999.  Pacing was discontinued the next day.  Patient was noted to be doing well overall. She was ambulating about the room.  She was on her oral antidysrhythmics.  On May 22, 1999, patient was noted to be doing well at [facility].  She had been in normal sinus rhythm for 36 hours on amiodarone.  She was deemed suitable to transfer back to unit 2000 in the morning if her rhythm was stable.  She was transferred to unit 2000 on May 24, 1999.  There she continued on her oral antidysrhythmics and anticoagulation.  Her rhythm was stable.  She had occasional anxiety that she  related to the nurse.  She was ambulating with cardiac rehab without difficulty. She was afebrile.  Her O2 saturations were marginal, around 90-91% on room air.  Coumadin was held on occasion because her INR was supratherapeutic.  On May 26, 1999, she was noted to be making steady progress and close to being able to be discharged home.  On May 27, 1999, she was noted to have made further progress  with an improvement in her O2 saturation.  She is not dyspneic at all and is very comfortable.  It is suspected that  part of her problems with her breathing is related to her history of anxiety.  Her bowel and bladder are well.  She is taking p.o.s well.  She is afebrile.  Her vital signs are stable and she is currently n normal sinus rhythm.  She is therapeutic with regard to her Coumadin.  Her laboratory data is good.  Her incisions are healing well with the exception of  small amount of serosanguinous drainage from the upper portion of her sternal wound.  She was started on Cipro 500 mg b.i.d. for this and will be followed up at the office after discharge.  It is noted that she has made slow, but steady progress and will be discharged home on Wednesday, May 28, 1999, pending satisfactory morning rounds.  MEDICATIONS:  1. Coumadin 2 mg tablet one p.o. q.d. as directed by INR.  2. Tenormin 25 mg tablet one p.o. b.i.d.  3. Amiodarone 200 mg tablet one p.o. q.d.  4. Avapro 150 mg tablet one p.o. q.d.  5. Lanoxin 0.125 mg tablet one p.o. q.d.  6. Lasix 40 mg tablet one p.o. q.d.  7. K-Dur 20 mEq tablet one p.o. q.d.  8. Cipro 500 mg tablet one p.o. b.i.d. x 7 days.  9. Premarin 1.25 mg tablet one p.o. q.d. 10. Combivent MDI two puffs q.i.d. 11. Prevacid 30 mg tablet one p.o. q.d. 12. Darvocet-N 100 1-2 p.o. q.4-6h. p.r.n. for pain.  ALLERGIES:   Patient is allergic to PENICILLIN and its derivatives.  They cause hives.  She is also allergic to ASPIRIN and CODEINE.  SPECIAL INSTRUCTIONS:  ACTIVITY:  Patient is instructed to engage in no strenuous activity or heavy lifting greater than 10 pounds.  She is told to walk daily and to use her incentive spirometer daily.  She was told that she can shower.  She was told to do no driving.  DIET:  She was told to maintain a low fat, low cholesterol, heart smart diet.  WOUND CARE:  She was told to keep her incisions clean and dry and to use soap and water only.  She was told to call the office if she notices anything unusual with regard to er wounds or has any questions whatsoever.  She was told to get a chest x-ray when she sees Dr. Daleen Squibb in follow-up and bring it with her when she sees Dr. Cornelius Vargas.  She is told the home health nurse will see her after discharge and will draw her blood to check her PT/INR on Friday.  FOLLOW-UP: 1. Patient is to go to CVTS office on Monday, June 02, 1999, to have her staples    removed from her leg and to inspect her wounds, in particular, her sternal    wound. 2. The patient is to follow up with Dr. Daleen Squibb in two weeks.  She is told to call and    arrange this appointment and to get a chest x-ray at that time. 3. Patient is to follow up with Dr. Cornelius Vargas in three weeks.  This appointment will be    made when she comes to the office to have her staples removed. DD:  05/27/99 TD:  05/28/99 Job: [id number]

## 2010-08-08 NOTE — Discharge Summary (Signed)
. [facility]  Patient:    [name], [name]                      MRN: [id number] Adm. Date:  [id number] Disc. Date: [id number] Attending:  Mirian Mo Dictator:   Lavella Hammock, P.A. CC:         Jesse Sans. Wall, M.D. [facility]             Butch Penny, M.D.                           Discharge Summary  DATE OF BIRTH: [birth year]  PROCEDURES: CAT scan of the chest.  HISTORY OF PRESENT ILLNESS: [name] is a 77 year old female with a history of bypass surgery who came to the hospital complaining of substernal chest pain.  This pain was not well relieved with nitroglycerin or morphine and her EKG was within normal limits and enzymes were negative for MI.  She had originally gone to [facility] [place] but she was transferred to [facility] for further evaluation and treatment.  HOSPITAL COURSE: It was felt that her pain was atypical and there was concern for a pulmonary embolus, so she had a CT scan of her chest.  The CT scan showed no embolus and no effusion, and no significant abnormalities.  She was pain-free in the hospital and had no significant arrhythmias.  Her enzymes were rechecked and were again negative.  Because her enzymes were negative for MI and she had no significant abnormality on her CT scan she was considered stable for discharge the p.m. of October 07, 1999.  The patient is to follow up with an outpatient Cardiolite at the office.  LABORATORY DATA: Hemoglobin 12.4, hematocrit 37.9, WBC 7.8; platelets 285,000. Sodium 139, potassium 3.7, chloride 103, CO2 28, BUN 10, creatinine 0.9, glucose 100.  Serial CK-MBs and troponin Is were negative for MI.  DISCHARGE CONDITION: Stable.  CONSULTATIONS: None.  COMPLICATIONS: None.  DISCHARGE DIAGNOSES:  1. Chest pain, status post aortocoronary bypass, follow-up as an outpatient     with Cardiolite.  2. History of chronic obstructive pulmonary  disease/emphysema.  3. Status post aortocoronary bypass in February 2001.  4. History of myocardial infarction.  5. Hypertension.  6. History of congestive heart failure.  7. History of hiatal hernia and reflux.  8. History of colitis, intolerant of dairy products.  9. History of arthritis. 10. Possible depression.  DISCHARGE ACTIVITY: As tolerated, with no strenuous activity.  DISCHARGE DIET: She is to stick to a low-fat diet.  FOLLOW-UP: She is to get a dobutamine Cardiolite in the office; will call. She is to follow up with Dr. Daleen Squibb in the office, will call with an appointment.  She is to follow up with Dr. Renard Matter as-needed and it is suggested she call soon.  DISCHARGE MEDICATIONS:  1. K-Dur 20 mEq q.d.  2. Lasix 40 mg q.d.  3. Premarin 1.25 mg q.d.  4. Avapro 150 mg q.d.  5. Theo-Dur 300 mg q.d.  6. Atenolol 50 mg 1/2 tablet b.i.d.  7. Nitroglycerin 0.4 mg sublingual p.r.n.  8. Xanax 0.5 mg p.r.n. DD:  10/07/99 TD:  10/08/99 Job: [id number]

## 2011-06-03 ENCOUNTER — Ambulatory Visit (HOSPITAL_COMMUNITY)
Admission: RE | Admit: 2011-06-03 | Discharge: 2011-06-03 | Disposition: A | Discharge status: Home / Self Care | Payer: Medicare Other | Source: Ambulatory Visit | Attending: Family Medicine | Admitting: Family Medicine

## 2011-06-03 ENCOUNTER — Other Ambulatory Visit (HOSPITAL_COMMUNITY): Payer: Self-pay | Admitting: Family Medicine

## 2011-06-03 DIAGNOSIS — M51379 Other intervertebral disc degeneration, lumbosacral region without mention of lumbar back pain or lower extremity pain: Secondary | ICD-10-CM | POA: Insufficient documentation

## 2011-06-03 DIAGNOSIS — M5137 Other intervertebral disc degeneration, lumbosacral region: Secondary | ICD-10-CM | POA: Insufficient documentation

## 2011-06-03 DIAGNOSIS — M545 Low back pain: Secondary | ICD-10-CM

## 2011-06-03 DIAGNOSIS — M47817 Spondylosis without myelopathy or radiculopathy, lumbosacral region: Secondary | ICD-10-CM | POA: Insufficient documentation

## 2011-06-11 ENCOUNTER — Other Ambulatory Visit (HOSPITAL_COMMUNITY): Payer: Self-pay | Admitting: Family Medicine

## 2011-06-11 ENCOUNTER — Ambulatory Visit (HOSPITAL_COMMUNITY)
Admission: RE | Admit: 2011-06-11 | Discharge: 2011-06-11 | Disposition: A | Discharge status: Home / Self Care | Payer: Medicare Other | Source: Ambulatory Visit | Attending: Family Medicine | Admitting: Family Medicine

## 2011-06-11 DIAGNOSIS — M25559 Pain in unspecified hip: Secondary | ICD-10-CM | POA: Insufficient documentation

## 2011-06-11 DIAGNOSIS — M1610 Unilateral primary osteoarthritis, unspecified hip: Secondary | ICD-10-CM | POA: Insufficient documentation

## 2011-06-11 DIAGNOSIS — M169 Osteoarthritis of hip, unspecified: Secondary | ICD-10-CM | POA: Insufficient documentation

## 2013-06-24 IMAGING — CR DG LUMBAR SPINE COMPLETE 4+V
5 series · 5 of 5 positions shown · non-contrast
Comparison: Under spine x-rays 09/24/2009 and MRI lumbar spine
01/31/2009 [HOSPITAL].

CLINICAL DATA: Chronic low back pain, presenting now with acute
symptoms, including radiation into the left lower extremity.  Prior
lumbar spine surgery 4111.

LUMBAR SPINE - COMPLETE 4+ VIEW chronic low back pain, 06/03/2011:

[view not recorded (1 of 5)]
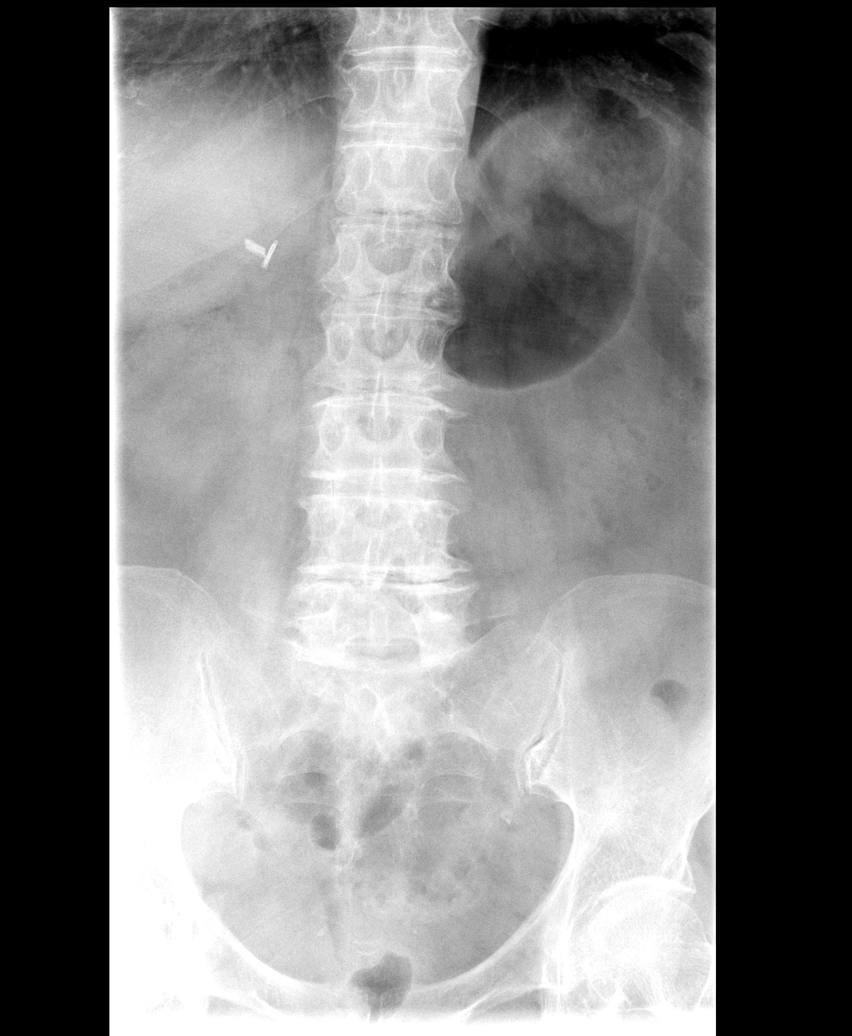

[view not recorded (2 of 5)]
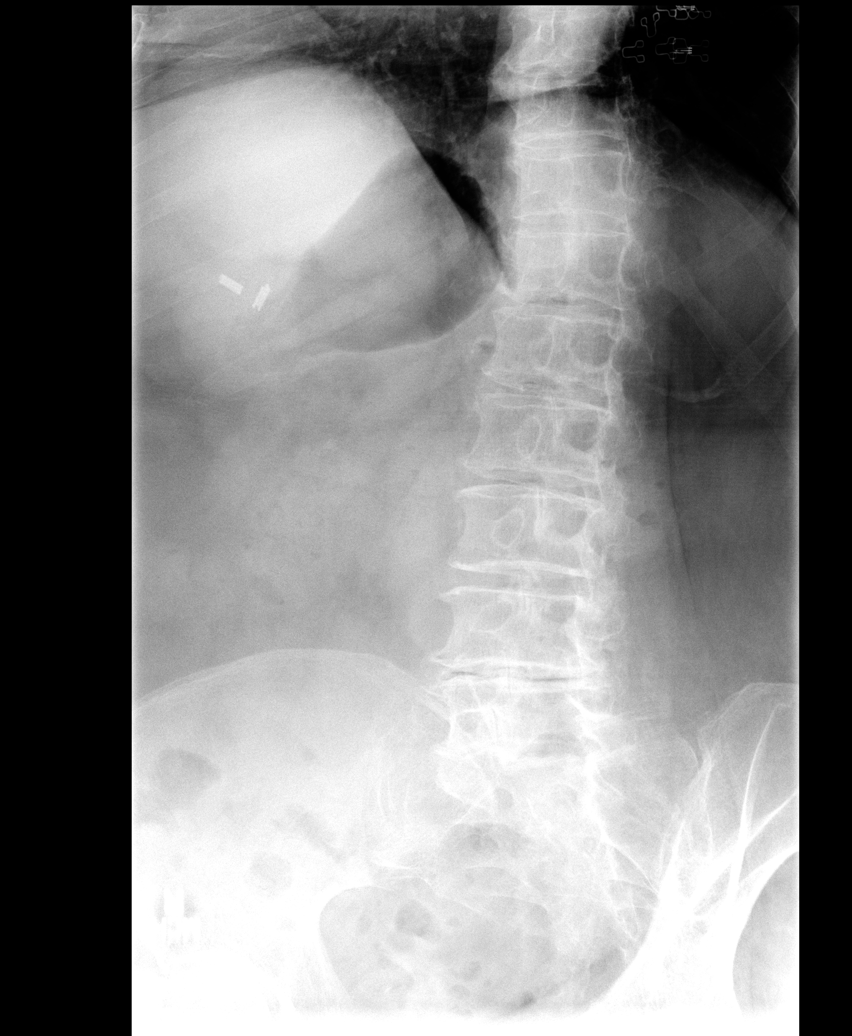

[view not recorded (3 of 5)]
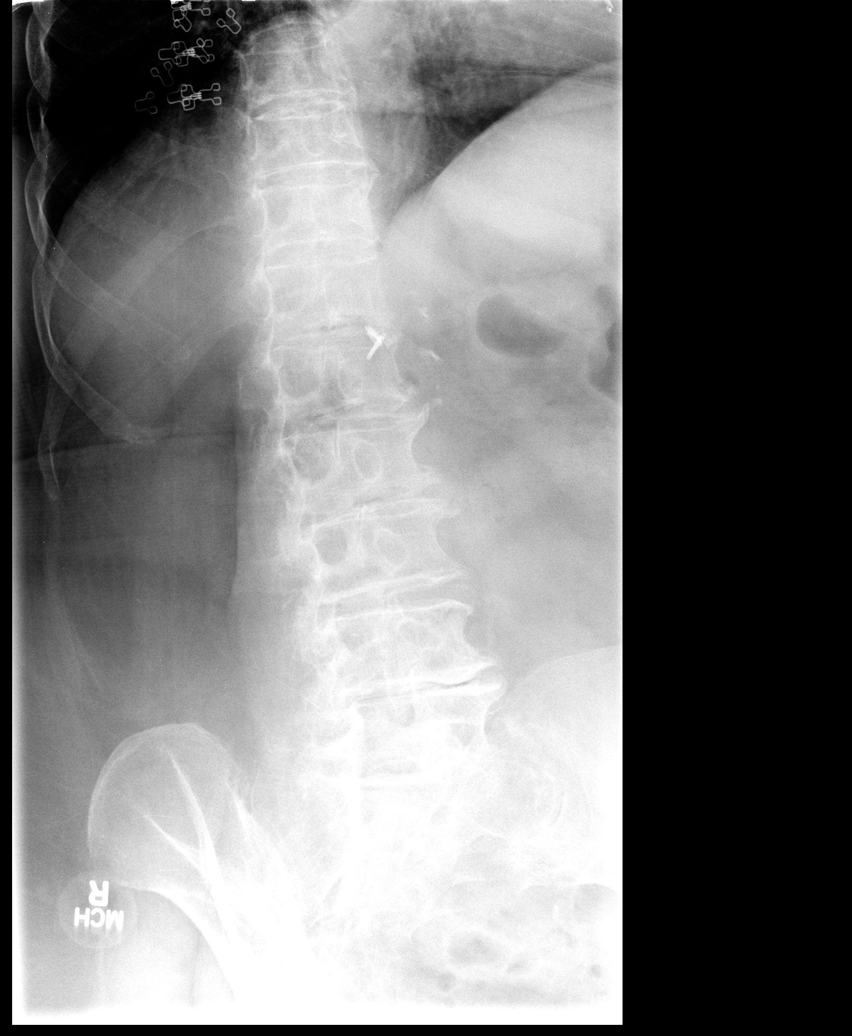

[view not recorded (4 of 5)]
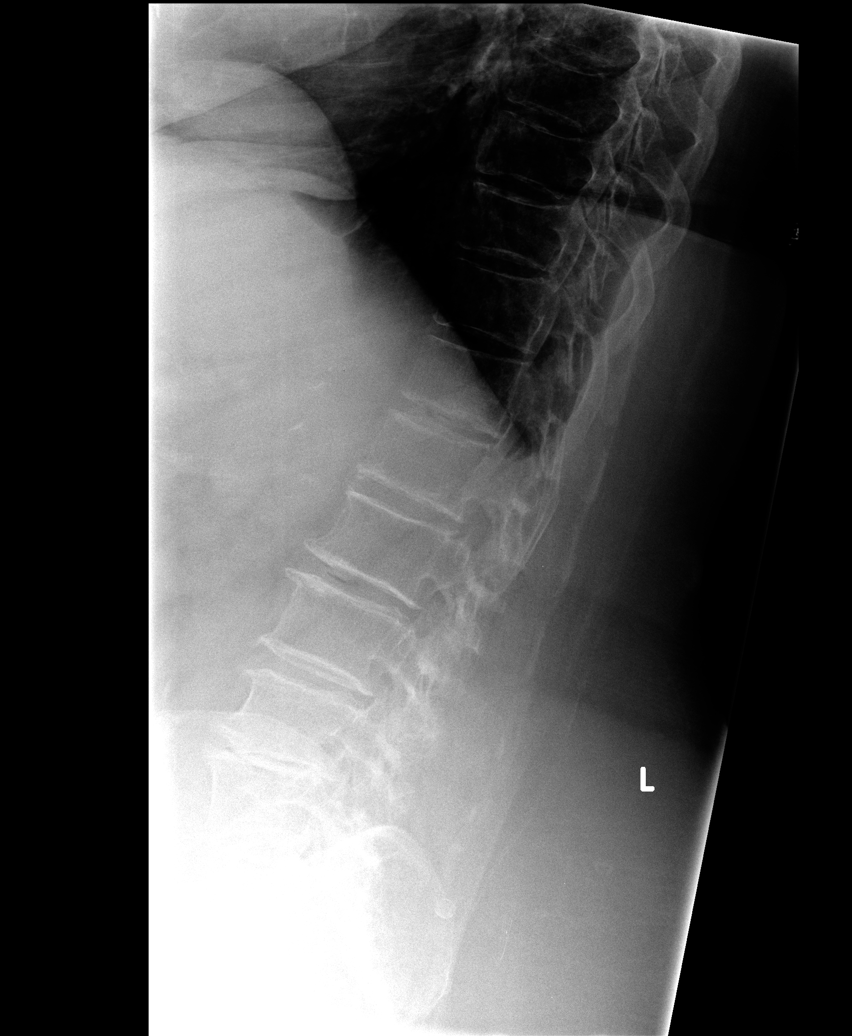

[view not recorded (5 of 5)]
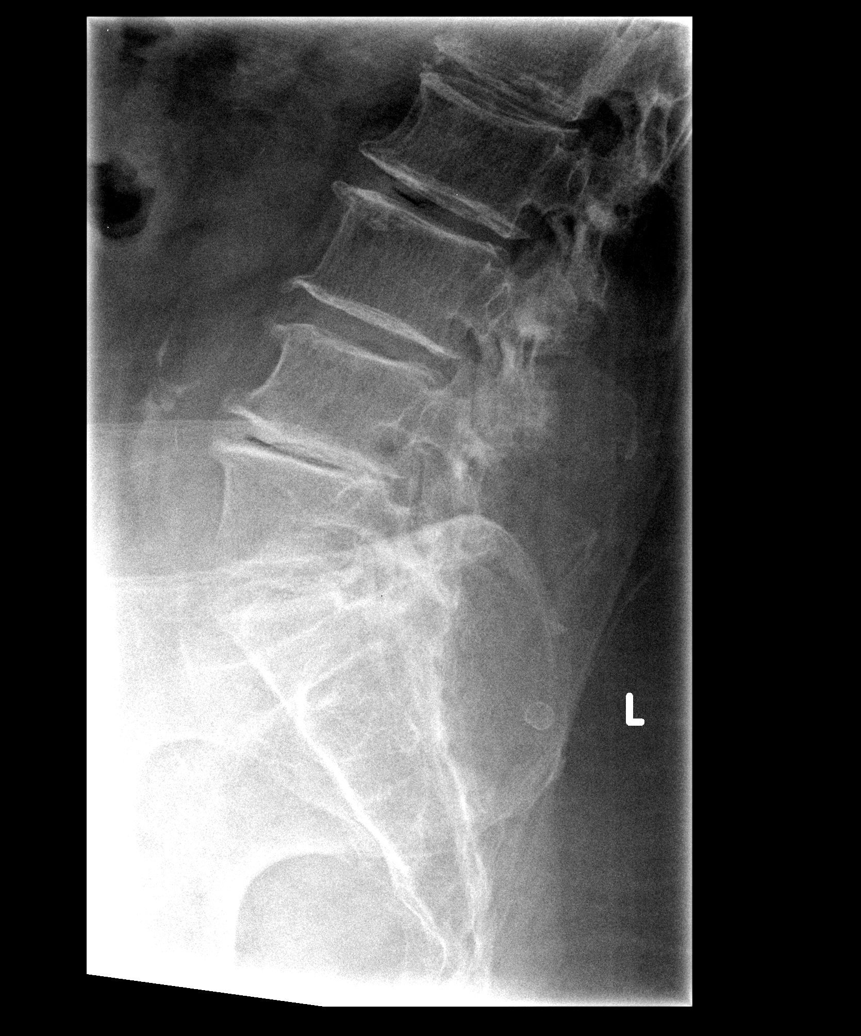

[5 of 5 positions shown; findings below may reference images not displayed]

FINDINGS: Five non-rib bearing lumbar vertebrae with anatomic
alignment.  Straightening of the usual lumbar lordosis.  No
fractures.  Disc space narrowing and endplate hypertrophic changes
at every lumbar level, worst at L4-5, progressive since the prior
examination.  Vacuum disc phenomenon at L4-5 and L2-3.  No pars
defects.  Diffuse facet degenerative changes.  Visualized
sacroiliac joints intact.
IMPRESSION: Degenerative disc disease and spondylosis at every lumbar level,
worst at L4-5, progressive since September 2009.  Diffuse facet
degenerative changes.

## 2015-07-11 ENCOUNTER — Encounter (INDEPENDENT_AMBULATORY_CARE_PROVIDER_SITE_OTHER): Payer: Self-pay | Admitting: *Deleted

## 2015-08-05 ENCOUNTER — Ambulatory Visit (INDEPENDENT_AMBULATORY_CARE_PROVIDER_SITE_OTHER): Payer: Medicare Other | Admitting: Internal Medicine

## 2015-12-28 ENCOUNTER — Encounter (HOSPITAL_COMMUNITY): Payer: Self-pay | Admitting: Emergency Medicine

## 2015-12-28 ENCOUNTER — Emergency Department (HOSPITAL_COMMUNITY)
Admission: EM | Admit: 2015-12-28 | Discharge: 2015-12-28 | Disposition: A | Discharge status: Home / Self Care | Payer: Medicare Other | Attending: Emergency Medicine | Admitting: Emergency Medicine

## 2015-12-28 DIAGNOSIS — G8929 Other chronic pain: Secondary | ICD-10-CM | POA: Insufficient documentation

## 2015-12-28 DIAGNOSIS — M5441 Lumbago with sciatica, right side: Secondary | ICD-10-CM | POA: Diagnosis not present

## 2015-12-28 DIAGNOSIS — M545 Low back pain: Secondary | ICD-10-CM | POA: Diagnosis present

## 2015-12-28 DIAGNOSIS — J45909 Unspecified asthma, uncomplicated: Secondary | ICD-10-CM | POA: Insufficient documentation

## 2015-12-28 HISTORY — DX: Acute myocardial infarction, unspecified: I21.9

## 2015-12-28 HISTORY — DX: Unspecified asthma, uncomplicated: J45.909

## 2015-12-28 LAB — BASIC METABOLIC PANEL
Anion gap: 5 (ref 5–15)
BUN: 28 mg/dL — AB (ref 6–20)
CO2: 30 mmol/L (ref 22–32)
Calcium: 8.6 mg/dL — ABNORMAL LOW (ref 8.9–10.3)
Chloride: 104 mmol/L (ref 101–111)
Creatinine, Ser: 1.31 mg/dL — ABNORMAL HIGH (ref 0.44–1.00)
GFR calc Af Amer: 41 mL/min — ABNORMAL LOW (ref >60)
GFR, EST NON AFRICAN AMERICAN: 36 mL/min — AB (ref >60)
GLUCOSE: 113 mg/dL — AB (ref 65–99)
POTASSIUM: 4.8 mmol/L (ref 3.5–5.1)
Sodium: 139 mmol/L (ref 135–145)

## 2015-12-28 LAB — CBC WITH DIFFERENTIAL/PLATELET
Basophils Absolute: 0 10*3/uL (ref 0.0–0.1)
Basophils Relative: 0 %
EOS PCT: 1 %
Eosinophils Absolute: 0.1 10*3/uL (ref 0.0–0.7)
HCT: 36.3 % (ref 36.0–46.0)
Hemoglobin: 11.2 g/dL — ABNORMAL LOW (ref 12.0–15.0)
LYMPHS ABS: 2 10*3/uL (ref 0.7–4.0)
LYMPHS PCT: 23 %
MCH: 30.9 pg (ref 26.0–34.0)
MCHC: 30.9 g/dL (ref 30.0–36.0)
MCV: 100.3 fL — AB (ref 78.0–100.0)
MONO ABS: 0.4 10*3/uL (ref 0.1–1.0)
Monocytes Relative: 4 %
Neutro Abs: 6.3 10*3/uL (ref 1.7–7.7)
Neutrophils Relative %: 72 %
PLATELETS: 216 10*3/uL (ref 150–400)
RBC: 3.62 MIL/uL — AB (ref 3.87–5.11)
RDW: 13.4 % (ref 11.5–15.5)
WBC: 8.7 10*3/uL (ref 4.0–10.5)

## 2015-12-28 MED ORDER — METHOCARBAMOL 1000 MG/10ML IJ SOLN
1000.0000 mg | Freq: Once | INTRAVENOUS | Status: AC
  Administered 2015-12-28: 1000 mg via INTRAVENOUS
  Filled 2015-12-28: qty 10

## 2015-12-28 MED ORDER — ONDANSETRON HCL 4 MG/2ML IJ SOLN
4.0000 mg | Freq: Once | INTRAMUSCULAR | Status: AC
  Administered 2015-12-28: 4 mg via INTRAVENOUS
  Filled 2015-12-28: qty 2

## 2015-12-28 MED ORDER — METHYLPREDNISOLONE 4 MG PO TBPK: ORAL_TABLET | ORAL | 0 refills | Status: AC

## 2015-12-28 MED ORDER — METHOCARBAMOL 500 MG PO TABS: 500.0000 mg | ORAL_TABLET | Freq: Three times a day (TID) | ORAL | 0 refills | Status: AC | PRN

## 2015-12-28 MED ORDER — DEXAMETHASONE SODIUM PHOSPHATE 10 MG/ML IJ SOLN
4.0000 mg | Freq: Once | INTRAMUSCULAR | Status: AC
  Administered 2015-12-28: 4 mg via INTRAVENOUS
  Filled 2015-12-28: qty 1

## 2015-12-28 MED ORDER — METHOCARBAMOL 1000 MG/10ML IJ SOLN
INTRAMUSCULAR | Status: AC
  Filled 2015-12-28: qty 10

## 2015-12-28 MED ORDER — FENTANYL CITRATE (PF) 100 MCG/2ML IJ SOLN
50.0000 ug | INTRAMUSCULAR | Status: DC | PRN
  Administered 2015-12-28 (×2): 50 ug via INTRAVENOUS
  Filled 2015-12-28 (×2): qty 2

## 2015-12-28 MED ORDER — MORPHINE SULFATE (PF) 2 MG/ML IV SOLN
2.0000 mg | INTRAVENOUS | Status: DC | PRN
Start: 2015-12-28 — End: 2015-12-29
  Administered 2015-12-28: 2 mg via INTRAVENOUS
  Filled 2015-12-28: qty 1

## 2015-12-28 MED ORDER — TRAMADOL HCL 50 MG PO TABS: 50.0000 mg | ORAL_TABLET | Freq: Four times a day (QID) | ORAL | 0 refills | Status: AC | PRN

## 2015-12-28 MED ORDER — DIAZEPAM 5 MG/ML IJ SOLN
2.5000 mg | Freq: Once | INTRAMUSCULAR | Status: AC
  Administered 2015-12-28: 2.5 mg via INTRAVENOUS
  Filled 2015-12-28: qty 2

## 2015-12-28 NOTE — ED Triage Notes (Addendum)
Pt reports back pain and right hip pain x1 year. Pt denies any recent fall. Pt alert and oriented. No deformity noted. Pt reports " my back and leg are spasming frequently". Pt also reports "urinary odor and dark color."

## 2015-12-28 NOTE — Discharge Instructions (Signed)
Contact your physician Monday morning for a follow-up appointment

## 2015-12-28 NOTE — ED Provider Notes (Signed)
[facility]-EMERGENCY DEPT Provider Note   CSN: [id number] Arrival date & time: 12/28/15  1801     History   Chief Complaint Chief Complaint  Patient presents with  . Back Pain    HPI [name] is a 82 y.o. female.  She presents for evaluation of back pain. She states is been present for over 1 year. She sees Dr.Vis in [place]. Was recently placed on a burst and taper of steroids. I previously been seen by Dr. Megan MansMcGinnis in town in [place]. Had had surgery on her back many years ago with Dr. Roxan Hockeyobinson. Pain is midline. Rates throughout her low back but not into her legs. Does not have weakness in her legs. She states it is severe at night. She is able to be up and around and left ago "get food" states she "hasn't been to church for a year". Now is having pain during the daytime. She expresses frustration about the chronicity of her symptoms rather than an acute exacerbation.      HPI  Past Medical History:  Diagnosis Date  . Asthma   . MI (myocardial infarction)     There are no active problems to display for this patient.   Past Surgical History:  Procedure Laterality Date  . ABDOMINAL HYSTERECTOMY    . CORONARY ARTERY BYPASS GRAFT     x4    OB History    No data available       Home Medications    Prior to Admission medications   Medication Sig Start Date End Date Taking? Authorizing Provider  methocarbamol (ROBAXIN) 500 MG tablet Take 1 tablet (500 mg total) by mouth 3 (three) times daily between meals as needed. 12/28/15   Rolland PorterMark Manna Gose, MD  methylPREDNISolone (MEDROL DOSEPAK) 4 MG TBPK tablet 6 po on day 1, then decrease by 1 per day 12/28/15   Rolland PorterMark Roslyn Else, MD  traMADol (ULTRAM) 50 MG tablet Take 1 tablet (50 mg total) by mouth every 6 (six) hours as needed. 12/28/15   Rolland PorterMark Juriel Cid, MD    Family History History reviewed. No pertinent family history.  Social History Social History  Substance Use Topics  . Smoking status: Never Smoker  . Smokeless tobacco:  Never Used  . Alcohol use No     Allergies   Aspirin; Morphine and related; and Penicillins   Review of Systems Review of Systems  Constitutional: Negative for appetite change, chills, diaphoresis, fatigue and fever.  HENT: Negative for mouth sores, sore throat and trouble swallowing.   Eyes: Negative for visual disturbance.  Respiratory: Negative for cough, chest tightness, shortness of breath and wheezing.   Cardiovascular: Negative for chest pain.  Gastrointestinal: Negative for abdominal distention, abdominal pain, diarrhea, nausea and vomiting.  Endocrine: Negative for polydipsia, polyphagia and polyuria.  Genitourinary: Negative for dysuria, frequency and hematuria.  Musculoskeletal: Positive for back pain. Negative for gait problem.  Skin: Negative for color change, pallor and rash.  Neurological: Negative for dizziness, syncope, light-headedness and headaches.  Hematological: Does not bruise/bleed easily.  Psychiatric/Behavioral: Negative for behavioral problems and confusion.     Physical Exam Updated Vital Signs BP (!) 112/38   Pulse 63   Temp 97.6 F (36.4 C) (Oral)   Resp 18   Ht 5\' 3"  (1.6 m)   Wt 210 lb (95.3 kg)   SpO2 100%   BMI 37.20 kg/m   Physical Exam  Constitutional: She is oriented to person, place, and time. She appears well-developed and well-nourished. No distress.  She  appears uncomfortable. However she is able to stand and ambulate short distances.  HENT:  Head: Normocephalic.  Eyes: Conjunctivae are normal. Pupils are equal, round, and reactive to light. No scleral icterus.  Neck: Normal range of motion. Neck supple. No thyromegaly present.  Cardiovascular: Normal rate and regular rhythm.  Exam reveals no gallop and no friction rub.   No murmur heard. Pulmonary/Chest: Effort normal and breath sounds normal. No respiratory distress. She has no wheezes. She has no rales.  Abdominal: Soft. Bowel sounds are normal. She exhibits no distension.  There is no tenderness. There is no rebound.  Musculoskeletal: Normal range of motion.       Arms: Patient complains of tenderness throughout the paraspinal muscular the low lumbar spine. Negative straight leg test. Normal Achilles and knee jerk reflexes. No deficits in strength or sensation in exam laying supine.  Neurological: She is alert and oriented to person, place, and time.  Skin: Skin is warm and dry. No rash noted.  Psychiatric: She has a normal mood and affect. Her behavior is normal.     ED Treatments / Results  Labs (all labs ordered are listed, but only abnormal results are displayed) Labs Reviewed  CBC WITH DIFFERENTIAL/PLATELET - Abnormal; Notable for the following:       Result Value   RBC 3.62 (*)    Hemoglobin 11.2 (*)    MCV 100.3 (*)    All other components within normal limits  BASIC METABOLIC PANEL - Abnormal; Notable for the following:    Glucose, Bld 113 (*)    BUN 28 (*)    Creatinine, Ser 1.31 (*)    Calcium 8.6 (*)    GFR calc non Af Amer 36 (*)    GFR calc Af Amer 41 (*)    All other components within normal limits    EKG  EKG Interpretation None       Radiology No results found.  Procedures Procedures (including critical care time)  Medications Ordered in ED Medications  ondansetron (ZOFRAN) injection 4 mg (4 mg Intravenous Given 12/28/15 1849)  methocarbamol (ROBAXIN) 1,000 mg in dextrose 5 % 50 mL IVPB (0 mg Intravenous Stopped 12/28/15 2007)  dexamethasone (DECADRON) injection 4 mg (4 mg Intravenous Given 12/28/15 1851)  diazepam (VALIUM) injection 2.5 mg (2.5 mg Intravenous Given 12/28/15 2043)     Initial Impression / Assessment and Plan / ED Course  I have reviewed the triage vital signs and the nursing notes.  Pertinent labs & imaging results that were available during my care of the patient were reviewed by me and considered in my medical decision making (see chart for details).  Clinical Course    An IV pain medications.  Symptoms do improve. No indications for admission or imaging at this time. No signs or symptoms or findings to suggest acute neurological process. I referred her to her primary care for treatment of her ongoing chronic back pain.  Final Clinical Impressions(s) / ED Diagnoses   Final diagnoses:  Chronic right-sided low back pain with right-sided sciatica    New Prescriptions Discharge Medication List as of 12/28/2015  9:04 PM    START taking these medications   Details  methocarbamol (ROBAXIN) 500 MG tablet Take 1 tablet (500 mg total) by mouth 3 (three) times daily between meals as needed., Starting Sat 12/28/2015, Print    methylPREDNISolone (MEDROL DOSEPAK) 4 MG TBPK tablet 6 po on day 1, then decrease by 1 per day, Print    traMADol Janean Sark)  50 MG tablet Take 1 tablet (50 mg total) by mouth every 6 (six) hours as needed., Starting Sat 12/28/2015, Print         Rolland Porter, MD 01/02/16 [phone]

## 2015-12-28 NOTE — ED Notes (Signed)
sats decreased to 86%, O2 applied [place] at 2 L/M-sats increased to 98%

## 2016-06-21 DEATH — deceased
